# Patient Record
Sex: Male | Born: 1950 | Race: White | Hispanic: No | Marital: Single | Smoking: Current every day smoker
Health system: Southern US, Community
[De-identification: ages and names within clinical notes are randomized; demographics above are authoritative.]

## PROBLEM LIST (undated history)

## (undated) DIAGNOSIS — E119 Type 2 diabetes mellitus without complications: Secondary | ICD-10-CM

## (undated) DIAGNOSIS — W3400XA Accidental discharge from unspecified firearms or gun, initial encounter: Secondary | ICD-10-CM

## (undated) DIAGNOSIS — I10 Essential (primary) hypertension: Secondary | ICD-10-CM

## (undated) HISTORY — PX: ANKLE SURGERY: SHX546

---

## 2012-05-24 ENCOUNTER — Emergency Department: Payer: Self-pay | Admitting: Emergency Medicine

## 2013-04-09 ENCOUNTER — Emergency Department: Payer: Self-pay | Admitting: Emergency Medicine

## 2014-02-10 ENCOUNTER — Emergency Department: Payer: Self-pay | Admitting: Emergency Medicine

## 2015-11-29 ENCOUNTER — Encounter: Payer: Self-pay | Admitting: Emergency Medicine

## 2015-11-29 ENCOUNTER — Emergency Department: Payer: Medicare Other

## 2015-11-29 ENCOUNTER — Emergency Department
Admission: EM | Admit: 2015-11-29 | Discharge: 2015-11-29 | Disposition: A | Discharge status: Home / Self Care | Payer: Medicare Other | Attending: Emergency Medicine | Admitting: Emergency Medicine

## 2015-11-29 DIAGNOSIS — Y999 Unspecified external cause status: Secondary | ICD-10-CM | POA: Insufficient documentation

## 2015-11-29 DIAGNOSIS — E119 Type 2 diabetes mellitus without complications: Secondary | ICD-10-CM | POA: Diagnosis not present

## 2015-11-29 DIAGNOSIS — I10 Essential (primary) hypertension: Secondary | ICD-10-CM | POA: Insufficient documentation

## 2015-11-29 DIAGNOSIS — M549 Dorsalgia, unspecified: Secondary | ICD-10-CM | POA: Diagnosis not present

## 2015-11-29 DIAGNOSIS — W19XXXA Unspecified fall, initial encounter: Secondary | ICD-10-CM

## 2015-11-29 DIAGNOSIS — Y939 Activity, unspecified: Secondary | ICD-10-CM | POA: Insufficient documentation

## 2015-11-29 DIAGNOSIS — F17210 Nicotine dependence, cigarettes, uncomplicated: Secondary | ICD-10-CM | POA: Diagnosis not present

## 2015-11-29 DIAGNOSIS — R079 Chest pain, unspecified: Secondary | ICD-10-CM | POA: Insufficient documentation

## 2015-11-29 DIAGNOSIS — W1839XA Other fall on same level, initial encounter: Secondary | ICD-10-CM | POA: Insufficient documentation

## 2015-11-29 DIAGNOSIS — Y929 Unspecified place or not applicable: Secondary | ICD-10-CM | POA: Insufficient documentation

## 2015-11-29 HISTORY — DX: Type 2 diabetes mellitus without complications: E11.9

## 2015-11-29 HISTORY — DX: Accidental discharge from unspecified firearms or gun, initial encounter: W34.00XA

## 2015-11-29 HISTORY — DX: Essential (primary) hypertension: I10

## 2015-11-29 LAB — CBC
HEMATOCRIT: 31 % — AB (ref 40.0–52.0)
HEMOGLOBIN: 11.3 g/dL — AB (ref 13.0–18.0)
MCH: 32.6 pg (ref 26.0–34.0)
MCHC: 36.4 g/dL — ABNORMAL HIGH (ref 32.0–36.0)
MCV: 89.5 fL (ref 80.0–100.0)
Platelets: 357 10*3/uL (ref 150–440)
RBC: 3.47 MIL/uL — ABNORMAL LOW (ref 4.40–5.90)
RDW: 13.7 % (ref 11.5–14.5)
WBC: 8.7 10*3/uL (ref 3.8–10.6)

## 2015-11-29 LAB — TROPONIN I: Troponin I: <0.03 ng/mL (ref <0.03)

## 2015-11-29 LAB — BASIC METABOLIC PANEL
ANION GAP: 10 (ref 5–15)
BUN: 20 mg/dL (ref 6–20)
CHLORIDE: 86 mmol/L — AB (ref 101–111)
CO2: 27 mmol/L (ref 22–32)
Calcium: 8.8 mg/dL — ABNORMAL LOW (ref 8.9–10.3)
Creatinine, Ser: 1.27 mg/dL — ABNORMAL HIGH (ref 0.61–1.24)
GFR calc Af Amer: >60 mL/min (ref >60)
GFR, EST NON AFRICAN AMERICAN: 58 mL/min — AB (ref >60)
GLUCOSE: 262 mg/dL — AB (ref 65–99)
POTASSIUM: 4.5 mmol/L (ref 3.5–5.1)
SODIUM: 123 mmol/L — AB (ref 135–145)

## 2015-11-29 MED ORDER — CYCLOBENZAPRINE HCL 10 MG PO TABS
10.0000 mg | ORAL_TABLET | Freq: Once | ORAL | Status: AC
  Administered 2015-11-29: 10 mg via ORAL
  Filled 2015-11-29: qty 1

## 2015-11-29 MED ORDER — CYCLOBENZAPRINE HCL 10 MG PO TABS: 10.0000 mg | ORAL_TABLET | Freq: Three times a day (TID) | ORAL | 0 refills | Status: AC | PRN

## 2015-11-29 NOTE — ED Triage Notes (Signed)
C/O falling onto face Friday evening.  Patient unsure if had a syncopal episode.  Since that time, patient denies further vertigo, but c/o chest pain from where he fell.

## 2015-11-29 NOTE — Discharge Instructions (Signed)
Please follow-up with your primary care doctor tomorrow for further evaluation. Please take your prescribed Flexeril as needed for discomfort, as prescribed. Return to the emergency department for any worsening pain, or any other symptoms personally concerning to yourself.
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
## 2015-11-29 NOTE — ED Notes (Signed)
Pt ambulated to bathroom without difficulty and without assistance. Pt requesting to use wheelchair to get to the car.

## 2017-04-07 IMAGING — CR DG CHEST 2V
2 series · 2 of 2 positions shown · non-contrast
Comparison: None.

CLINICAL DATA: Syncopal episode.

EXAM:
CHEST  2 VIEW

[chest pa]
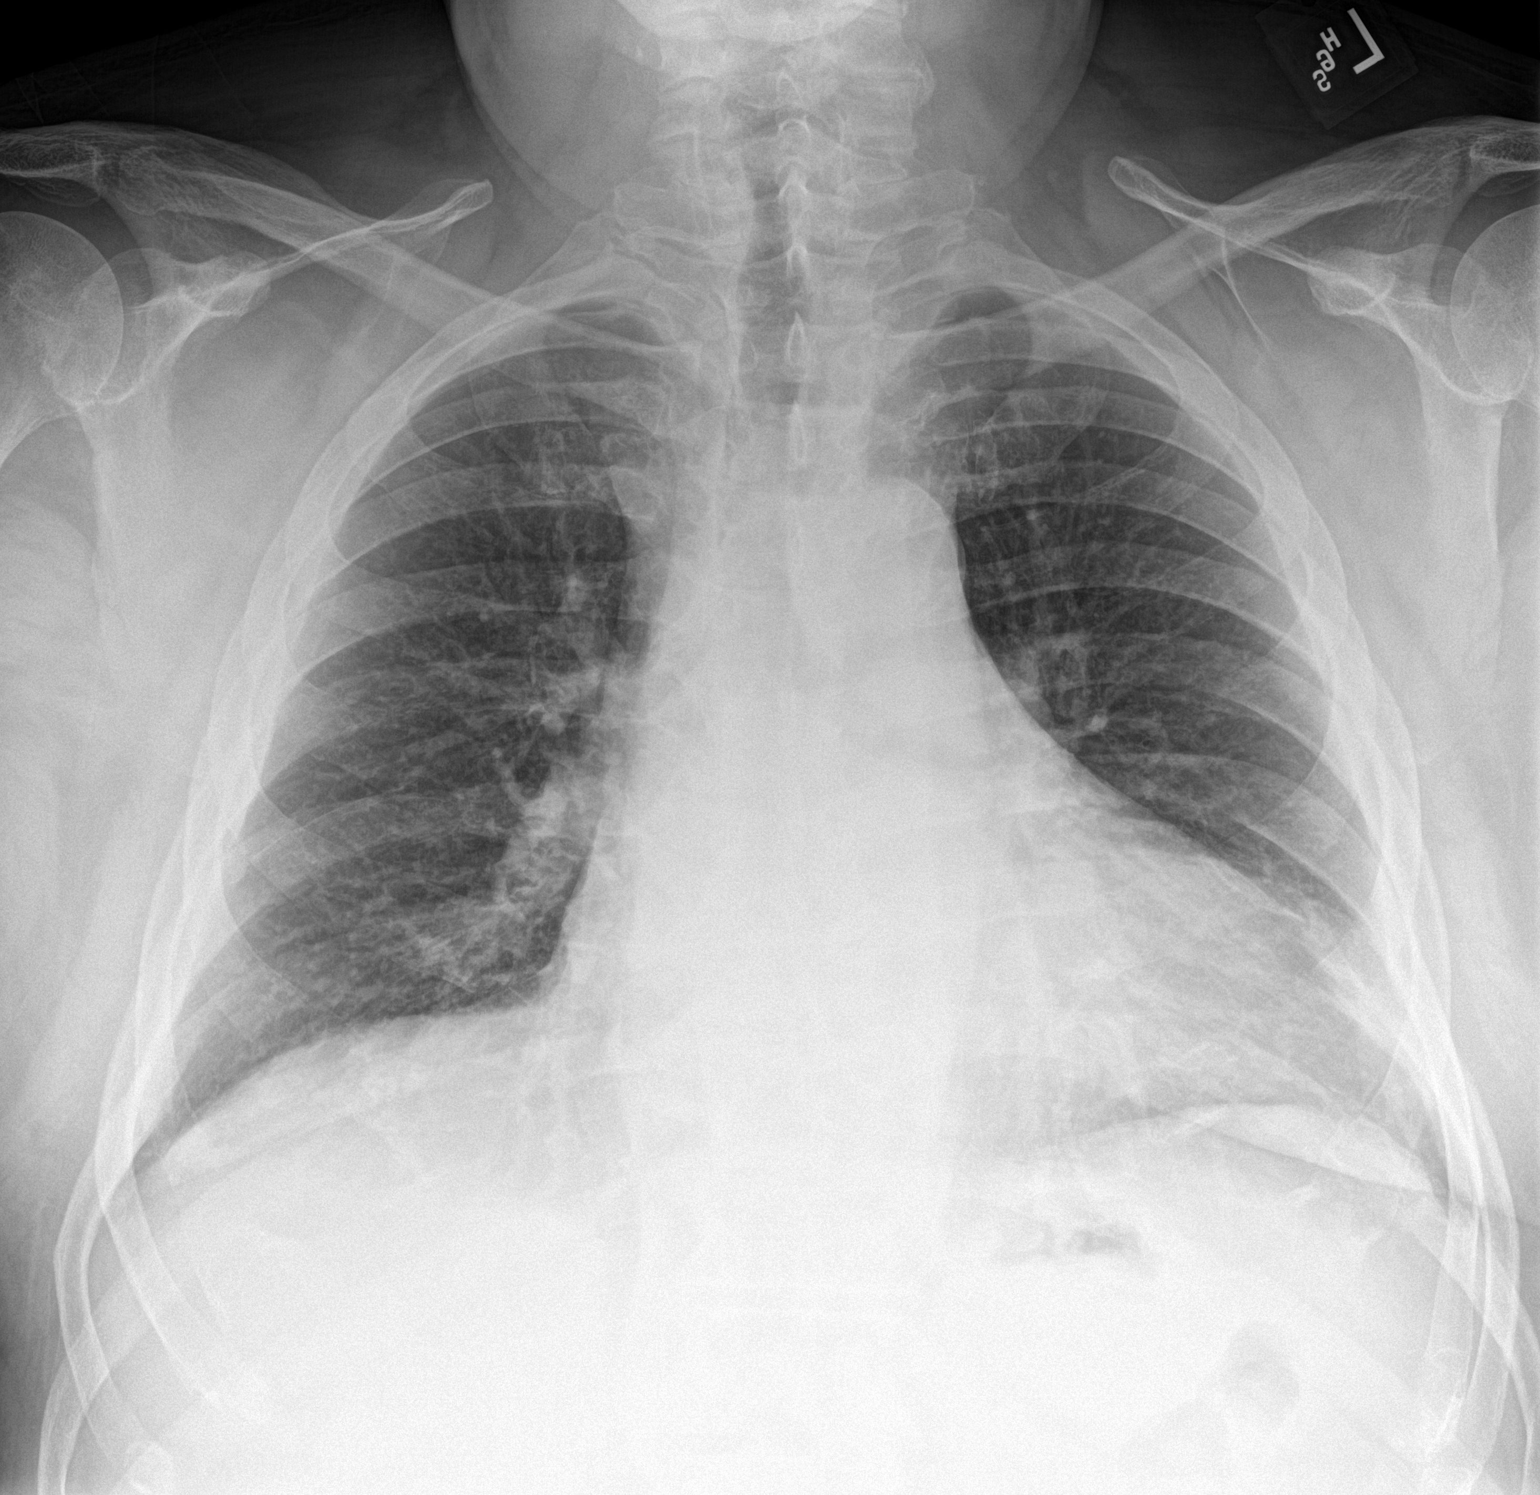

[chest lat]
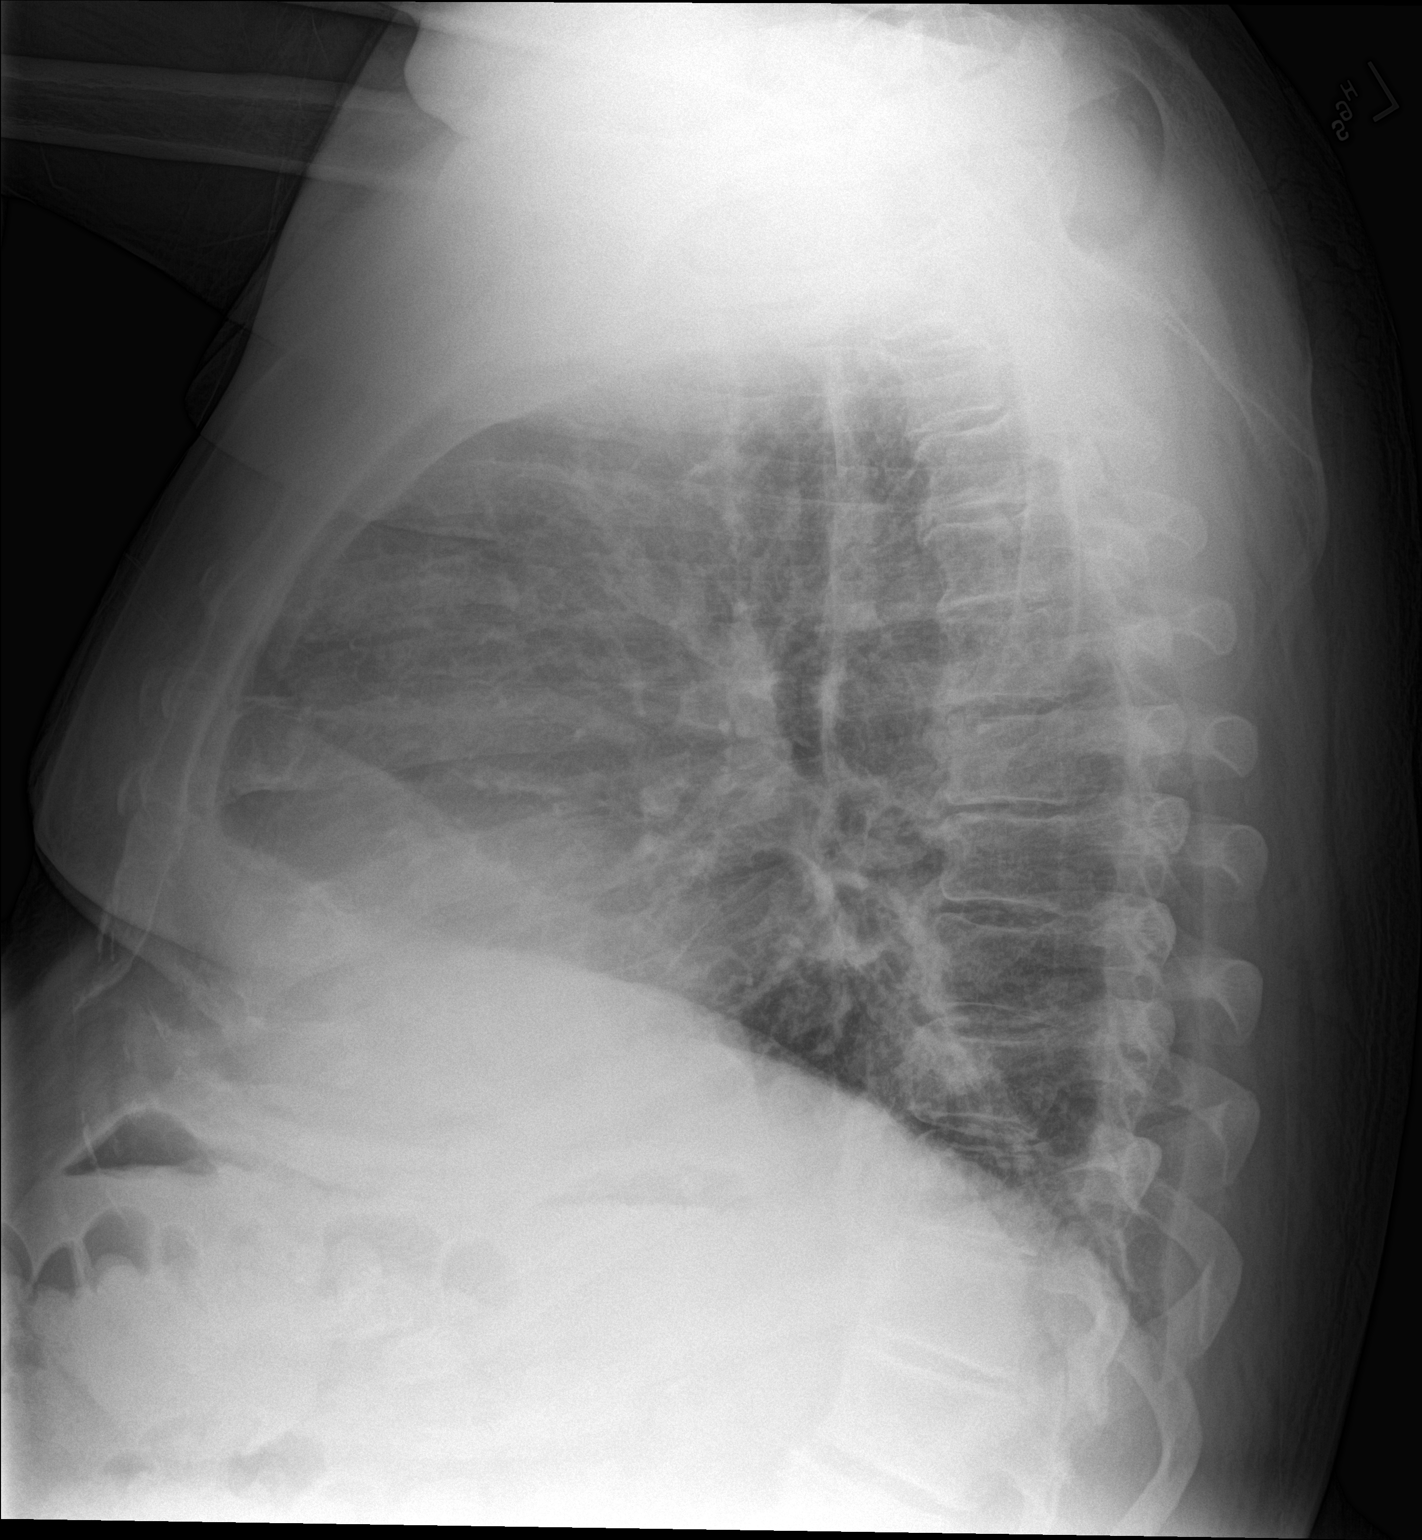

[2 of 2 positions shown; findings below may reference images not displayed]

FINDINGS: The cardiac silhouette is enlarged. Mediastinal contours appear
intact.

There is no evidence of focal airspace consolidation, pleural
effusion or pneumothorax. Mild crowding of the interstitial markings
may be seen with pulmonary vascular congestion or low lung volume.

Osseous structures are without acute abnormality. Soft tissues are
grossly normal.
IMPRESSION: Enlarged cardiac silhouette.

Mild crowding of the interstitial markings which may be seen with
pulmonary vascular congestion a low lung volume.
